# Patient Record
Sex: Female | Born: 1960 | Hispanic: No
Health system: Southern US, Community
[De-identification: ages and names within clinical notes are randomized; demographics above are authoritative.]

## PROBLEM LIST (undated history)

## (undated) DIAGNOSIS — R413 Other amnesia: Secondary | ICD-10-CM

## (undated) DIAGNOSIS — F419 Anxiety disorder, unspecified: Secondary | ICD-10-CM

## (undated) DIAGNOSIS — E7800 Pure hypercholesterolemia, unspecified: Secondary | ICD-10-CM

## (undated) DIAGNOSIS — M549 Dorsalgia, unspecified: Secondary | ICD-10-CM

## (undated) DIAGNOSIS — G4730 Sleep apnea, unspecified: Secondary | ICD-10-CM

## (undated) DIAGNOSIS — R251 Tremor, unspecified: Secondary | ICD-10-CM

## (undated) DIAGNOSIS — I10 Essential (primary) hypertension: Secondary | ICD-10-CM

## (undated) HISTORY — DX: Other amnesia: R41.3

## (undated) HISTORY — PX: OTHER SURGICAL HISTORY: SHX169

## (undated) HISTORY — DX: Dorsalgia, unspecified: M54.9

## (undated) HISTORY — DX: Sleep apnea, unspecified: G47.30

## (undated) HISTORY — PX: TONSILLECTOMY AND ADENOIDECTOMY: SHX28

## (undated) HISTORY — DX: Tremor, unspecified: R25.1

## (undated) HISTORY — DX: Essential (primary) hypertension: I10

## (undated) HISTORY — PX: APPENDECTOMY: SHX54

## (undated) HISTORY — DX: Pure hypercholesterolemia, unspecified: E78.00

## (undated) HISTORY — DX: Anxiety disorder, unspecified: F41.9

---

## 1999-05-12 ENCOUNTER — Other Ambulatory Visit: Admission: RE | Admit: 1999-05-12 | Discharge: 1999-05-12 | Payer: Self-pay | Admitting: Obstetrics & Gynecology

## 2000-05-31 ENCOUNTER — Other Ambulatory Visit: Admission: RE | Admit: 2000-05-31 | Discharge: 2000-05-31 | Payer: Self-pay | Admitting: Obstetrics & Gynecology

## 2000-06-18 ENCOUNTER — Encounter: Payer: Self-pay | Admitting: Obstetrics & Gynecology

## 2000-06-18 ENCOUNTER — Encounter: Admission: RE | Admit: 2000-06-18 | Discharge: 2000-06-18 | Payer: Self-pay | Admitting: Obstetrics & Gynecology

## 2001-06-26 ENCOUNTER — Other Ambulatory Visit: Admission: RE | Admit: 2001-06-26 | Discharge: 2001-06-26 | Payer: Self-pay | Admitting: Obstetrics & Gynecology

## 2001-06-26 ENCOUNTER — Encounter: Admission: RE | Admit: 2001-06-26 | Discharge: 2001-06-26 | Payer: Self-pay | Admitting: Obstetrics & Gynecology

## 2001-06-26 ENCOUNTER — Encounter: Payer: Self-pay | Admitting: Obstetrics & Gynecology

## 2001-11-21 ENCOUNTER — Ambulatory Visit (HOSPITAL_COMMUNITY): Admission: RE | Admit: 2001-11-21 | Discharge: 2001-11-21 | Payer: Self-pay | Admitting: Obstetrics & Gynecology

## 2002-05-02 ENCOUNTER — Encounter: Admission: RE | Admit: 2002-05-02 | Discharge: 2002-05-02 | Payer: Self-pay | Admitting: Neurosurgery

## 2002-05-02 ENCOUNTER — Encounter: Payer: Self-pay | Admitting: Neurosurgery

## 2002-05-14 ENCOUNTER — Encounter: Admission: RE | Admit: 2002-05-14 | Discharge: 2002-05-14 | Payer: Self-pay | Admitting: Neurosurgery

## 2002-05-14 ENCOUNTER — Encounter: Payer: Self-pay | Admitting: Neurosurgery

## 2002-05-26 ENCOUNTER — Encounter: Payer: Self-pay | Admitting: Neurosurgery

## 2002-05-26 ENCOUNTER — Encounter: Admission: RE | Admit: 2002-05-26 | Discharge: 2002-05-26 | Payer: Self-pay | Admitting: Neurosurgery

## 2002-09-12 ENCOUNTER — Other Ambulatory Visit: Admission: RE | Admit: 2002-09-12 | Discharge: 2002-09-12 | Payer: Self-pay | Admitting: Obstetrics & Gynecology

## 2003-04-21 ENCOUNTER — Encounter: Payer: Self-pay | Admitting: Obstetrics & Gynecology

## 2003-04-21 ENCOUNTER — Encounter: Admission: RE | Admit: 2003-04-21 | Discharge: 2003-04-21 | Payer: Self-pay | Admitting: Obstetrics & Gynecology

## 2003-09-15 ENCOUNTER — Ambulatory Visit (HOSPITAL_COMMUNITY): Admission: RE | Admit: 2003-09-15 | Discharge: 2003-09-16 | Payer: Self-pay | Admitting: Neurosurgery

## 2004-02-18 ENCOUNTER — Other Ambulatory Visit: Admission: RE | Admit: 2004-02-18 | Discharge: 2004-02-18 | Payer: Self-pay | Admitting: Obstetrics and Gynecology

## 2005-05-02 ENCOUNTER — Other Ambulatory Visit: Admission: RE | Admit: 2005-05-02 | Discharge: 2005-05-02 | Payer: Self-pay | Admitting: Obstetrics & Gynecology

## 2005-05-29 ENCOUNTER — Encounter: Admission: RE | Admit: 2005-05-29 | Discharge: 2005-05-29 | Payer: Self-pay | Admitting: Orthopedic Surgery

## 2005-09-19 ENCOUNTER — Encounter: Admission: RE | Admit: 2005-09-19 | Discharge: 2005-09-19 | Payer: Self-pay | Admitting: *Deleted

## 2006-02-22 ENCOUNTER — Encounter (INDEPENDENT_AMBULATORY_CARE_PROVIDER_SITE_OTHER): Payer: Self-pay | Admitting: Specialist

## 2006-02-23 ENCOUNTER — Inpatient Hospital Stay (HOSPITAL_COMMUNITY): Admission: RE | Admit: 2006-02-23 | Discharge: 2006-02-24 | Payer: Self-pay | Admitting: Obstetrics & Gynecology

## 2006-10-18 ENCOUNTER — Encounter: Admission: RE | Admit: 2006-10-18 | Discharge: 2006-10-18 | Payer: Self-pay | Admitting: Orthopedic Surgery

## 2007-02-20 ENCOUNTER — Encounter: Admission: RE | Admit: 2007-02-20 | Discharge: 2007-02-20 | Payer: Self-pay | Admitting: Internal Medicine

## 2008-04-06 ENCOUNTER — Encounter: Admission: RE | Admit: 2008-04-06 | Discharge: 2008-04-06 | Payer: Self-pay | Admitting: Obstetrics & Gynecology

## 2008-04-14 ENCOUNTER — Encounter: Admission: RE | Admit: 2008-04-14 | Discharge: 2008-04-14 | Payer: Self-pay | Admitting: Obstetrics & Gynecology

## 2009-03-17 ENCOUNTER — Encounter: Admission: RE | Admit: 2009-03-17 | Discharge: 2009-03-17 | Payer: Self-pay | Admitting: Neurosurgery

## 2009-04-07 ENCOUNTER — Encounter: Admission: RE | Admit: 2009-04-07 | Discharge: 2009-04-07 | Payer: Self-pay | Admitting: Neurosurgery

## 2009-05-18 ENCOUNTER — Encounter: Admission: RE | Admit: 2009-05-18 | Discharge: 2009-05-18 | Payer: Self-pay | Admitting: Obstetrics & Gynecology

## 2009-11-15 ENCOUNTER — Encounter: Admission: RE | Admit: 2009-11-15 | Discharge: 2009-11-15 | Payer: Self-pay | Admitting: Obstetrics & Gynecology

## 2010-04-08 ENCOUNTER — Encounter: Admission: RE | Admit: 2010-04-08 | Discharge: 2010-04-08 | Payer: Self-pay | Admitting: Neurosurgery

## 2010-05-25 ENCOUNTER — Encounter: Admission: RE | Admit: 2010-05-25 | Discharge: 2010-05-25 | Payer: Self-pay | Admitting: Obstetrics & Gynecology

## 2010-10-31 ENCOUNTER — Encounter: Payer: Self-pay | Admitting: Obstetrics & Gynecology

## 2011-02-24 NOTE — Op Note (Signed)
NAMEDIOSELINA, BRUMBAUGH           ACCOUNT NO.:  [id number]   MEDICAL RECORD NO.:  [id number]          PATIENT TYPE:  OIB   LOCATION:  9314                          FACILITY:  [facility]   PHYSICIAN:  Freddy Finner, M.D.   DATE OF BIRTH:  [birth year]   DATE OF PROCEDURE:  02/22/2006  DATE OF DISCHARGE:                                 OPERATIVE REPORT   PREOPERATIVE DIAGNOSES:  1.  Uterine enlargement, suspected adenomyosis.  2.  History of pelvic endometriosis.  3.  History of pelvic adhesions.  4.  Menorrhagia.  5.  Debilitating dysmenorrhea.   POSTOPERATIVE DIAGNOSES:  1.  Uterine enlargement, filmy adhesions of left adnexa.  2.  Old scarring consistent with old endometriosis with no current active      lesions visible.  No other apparent abnormalities in the upper abdomen,      although appendix was not visualized.   OPERATIVE PROCEDURE:  1.  Laparoscopically assisted vaginal hysterectomy.  2.  Bilateral salpingo-oophorectomy.  3.  Lysis of pelvic adhesions.   SURGEON:  Freddy Finner, M.D.   ASSISTANT:  Dineen Kid. Rana Snare, M.D.   ESTIMATED BLOOD LOSS:  300 cc.   COMPLICATIONS:  None.   INDICATIONS FOR PROCEDURE:  The patient is a 50 year old who has previously  had laparoscopically diagnosed pelvic endometriosis with omental and pelvic  adhesions and active endometriosis.  The uterus was also noted to be  enlarged at that time and thought consistent with adenomyosis.  In the  recent past she has had progressively increasing symptoms with severe  dysmenorrhea/menorrhagia.  She is admitted now for surgery.   DESCRIPTION OF PROCEDURE:  She was admitted on the morning of surgery.  She  was given an antibiotic bolus preoperatively.  She was placed in PASOs.  She  was brought to the operating room and placed under adequate general  anesthesia, placed in the dorsal lithotomy position using the [place] stirrup  system.  Betadine prep of abdomen and perineum with vagina was carried out  in the usual fashion with scrub followed by solution.  Latex free setup was  used for this patient.  Bladder was evacuated with a Silastic catheter.  Hulka tenaculum was attached to the cervix.  Sterile drapes were applied.  Two small incisions were made in the abdomen, one at the umbilicus and one  just above the symphysis in the midline.  An 11 mm nonbladed disposable  trocar was introduced at the umbilicus while elevating the anterior  abdominal wall manually.  Examination of pelvic and abdominal contents was  carried out.  A 5 mm trocar was placed in the lower incision for additional  operative port.  There were some filmy adhesions of the left ovary to the  left pelvic sidewall and the epiploic of the sigmoid to the ovary.  These  were easily lysed with blunt dissection.  There was no active evidence of  endometriosis.  Photographs were made.  Using the Gyrus tripolar device  through the operating shallow of the laparoscope the right infundibular  pelvic round and upper broad ligament was developed, sealed, divided,  carried down to  a level just above the uterine artery.  The left side was  then treated identically after lysis of the adhesion.  The bladder was noted  to be advanced on the lower segment.  Attention was then turned vaginally.  Weighted posterior vaginal retractor was placed.  Deaver retractors were  used anteriorly and laterally for  exposure.  The Hulka tenaculum was replaced with a Jacobs tenaculum.  Colpotomy incision was made while tending the mucosa posterior to the  cervix.  Cervix was circumscribed with a scalpel.  The [employer] style  clamp was then used to seal and divide the uterosacrals and bladder pillars  as separate pedicles .  The bladder was further carefully advanced off the  cervix with sharp and blunt dissection.  The cardinal ligament pedicle was  taken, sealed, and divided using Jason Nest device.  Anterior peritoneum was  then entered.  Vessel  pedicles were taken, sealed, and divided.  Uterus was  then delivered through the vaginal introitus.  One filmy peritoneal band was  left but actually tore on delivery of the uterus, but there was no active  bleeding from this peritoneum.  Angles of the vagina were anchored to the  uterosacrals with mattress suture, 0 Monocryl.  Uterosacrals were plicated  in the midline and the posterior peritoneum closed with an interrupted 0  Monocryl suture.  Cuff was closed vertically with figure-of-eights of 0  Monocryl.  Silastic Foley was then placed.  Reinspection abdominally was  carried out using copious irrigation with the [place] irrigating system.  Minimal small bleeding sources were controlled with a bipolar forceps.  All  blood and irrigating solution was aspirated from the abdomen.  Hemostasis  was complete under irrigation and under direct visualization.  The  instruments were removed.  Gas was allowed to escape from the abdomen.  The  incisions were anesthetized with 0.5% plain Marcaine.  The incisions were  closed with interrupted subcuticular sutures of 3-0 Dexon.  Quarter inch  Steri-Strips were applied to the lower incision.  The patient was awakened  and taken to recovery in good condition.      Freddy Finner, M.D.  Electronically Signed     WRN/MEDQ  D:  02/22/2006  T:  02/22/2006  Job:  [id number]

## 2011-02-24 NOTE — H&P (Signed)
[name], [name]           ACCOUNT NO.:  [id number]   MEDICAL RECORD NO.:  [id number]          PATIENT TYPE:  AMB   LOCATION:  SDC                           FACILITY:  [facility]   PHYSICIAN:  Freddy Finner, M.D.   DATE OF BIRTH:  [birth year]   DATE OF ADMISSION:  02/22/2006  DATE OF DISCHARGE:                                HISTORY & PHYSICAL   ADMISSION DIAGNOSIS:  Known pelvic endometriosis, menorrhagia, associated  with pelvic pain, dysmenorrhea unresponsive to hormonal manipulation with  oral contraceptives.   HISTORY OF PRESENT ILLNESS:  The patient is a 50 year old white, married  female, gravida 2, para 1, who has had a long history of pelvic  endometriosis.  Her only term pregnancy delivered in 1996.  She had  laparoscopy in February 2003 for chronic pelvic pain and requested voluntary  sterilization.  At that time, there was slight uterine enlargement thought  consistent with adenomyosis.  There were pelvic and omental adhesions which  were lysed.  There were endometriotic lesions along both the left and right  pelvic sidewalls which were ablated with bipolar coagulation as well as a  uterosacral nerve ablation.  She had coagulation and division of her  fallopian tubes for sterilization.  However, since that time, she has  continued to have significant GYN morbidity including severe pelvic pain  preceding the onset and with the first day of her menses.  She has  dyspareunia.  She has had increasing menorrhagia and has failed to respond  to oral contraceptives for this problem.  She has requested definitive  surgical intervention.  She is admitted at this time for laparoscopically  assisted vaginal hysterectomy and bilateral salpingo-oophorectomy.   REVIEW OF SYMPTOMS:  Her current review of systems is further remarkable for  chronic low back and right hip pain.  She has known significant disk disease  and has previously had a lumbar laminectomy and microdiskectomy at  L5-S1.  Her current GI review is negative.  GU review is negative acutely except as  noted above.  She has a very long history of recurrent bladder infections  and recurrent vaginitis.   PAST MEDICAL HISTORY:  The patient has hypertension for which she takes  Norvasc 5 mg a day and Atenolol 25 mg a day and Lasix 20 mg b.i.d.  She  takes a potassium supplement.  She also has hypercholesterolemia for which  she takes Lipitor 40 mg a day.  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  In addition, she takes medication chronically for her low  back pain including Lyrica 100 mg t.i.d., Oxycodone 50 mg every 4 hours, and  Mobic p.r.n.  She has no other known medical conditions.   PAST MEDICAL HISTORY:  Laparoscopy noted above.  She had laparoscopy for  procedure with two pregnancy attempts in 1994 and 1995.  She had  appendectomy in 1996.  She had tonsillectomy at age 82.  She had  maxillofacial and nasal septoplasty surgeries in 1983.   ALLERGIES:  She is known to be allergic to SULFA AND ERYTHROMYCIN.  She is  also allergic to RADIOGRAPHIC DYE  but is not allergic to the iodine  containing dyes.   She has never had a blood transfusion.   FAMILY HISTORY:  Noncontributory.   PHYSICAL EXAMINATION:  HEENT:  Grossly within normal limits.  Thyroid gland is not palpably  enlarged.  VITAL SIGNS:  Her most recent blood pressure in the office 130/90.  CHEST:  Clear to auscultation.  HEART:  Normal sinus rhythm without murmurs, gallops, and rubs.  ABDOMEN:  Soft, there is no appreciable organomegaly or palpable masses.  There is mild suprapubic tenderness to deep palpation.  PELVIC:  External genitalia, vagina, and cervix were normal to inspection.  Bimanual is essentially normal.  There are no palpable adnexal masses.  RECTAL:  Normal.  Rectovaginal exam confirms the above findings.   ASSESSMENT:  Chronic pelvic pain, menorrhagia, dysmenorrhea, dyspareunia,  failed conservative  management.   PLAN:  Laparoscopically assisted vaginal hysterectomy with bilateral  salpingo-oophorectomy.  The patient has viewed the video in the office  describing the procedure including the potential risks of the procedure and  is prepared to proceed.  She will be treated peri operatively with  antiembolic hose, with IV antibiotic.      Freddy Finner, M.D.  Electronically Signed     WRN/MEDQ  D:  02/21/2006  T:  02/21/2006  Job:  [id number]

## 2011-02-24 NOTE — Discharge Summary (Signed)
[name], [name]           ACCOUNT NO.:  [id number]   MEDICAL RECORD NO.:  [id number]          PATIENT TYPE:  INP   LOCATION:  9314                          FACILITY:  [facility]   PHYSICIAN:  Freddy Finner, M.D.   DATE OF BIRTH:  1961/01/20   DATE OF ADMISSION:  02/22/2006  DATE OF DISCHARGE:  02/24/2006                                 DISCHARGE SUMMARY   DISCHARGE DIAGNOSES:  Uterine enlargement with extensive uterine  adenomyosis, pelvic adhesions, pelvic scarring consistent with pelvic  endometriosis.  Clinical symptoms of severe menorrhagia, chronic pelvic  pain, dyspareunia.  Symptoms unresponsive to conservative management with  oral contraceptives.   OPERATIVE PROCEDURE:  Laparoscopically assisted vaginal hysterectomy,  bilateral salpingo-oophorectomy, lysis of pelvic adhesions.   INTRAOPERATIVE AND POSTOPERATIVE COMPLICATIONS:  None.   DISPOSITION:  The patient was in satisfactory improved condition at time of  discharge.  She was discharged home with progressively increasing physical  activity but to avoid heavy lifting or vaginal entry.  She is to take a  regular diet as tolerated.  She is to resume her chronic pain management  medications which she receives Dr. Vear Clock.  She also was given Xanax 0.5  mg to be taken t.i.d. as needed as supplement to her pain management  protocol.   SIGNIFICANT SECONDARY DIAGNOSIS:  Chronic low back pain and chronic pain  management requiring large amounts of medications.   Details of present illness, past history, family history, review of systems  and physical exam recorded in the admission note.  Her physical findings on  admission were remarkable for the pelvic findings with enlargement and  tenderness of the uterus and significant for her clinical history of severe  problems.   LABORATORY DATA:  During this admission includes a preoperative hemoglobin  of 13.7, postoperative hemoglobin of 10.6, admission prothrombin time and  PTT which were normal.  Normal urinalysis on admission.   HOSPITAL COURSE:  The patient was admitted on the morning of surgery.  The  above described operative procedure was accomplished without any  intraoperative complications.  Her postoperative course was remarkable for  extreme pain which is a chronic ongoing situation for her.  She did remain  afebrile throughout her postoperative stay.  She stayed for two days for  postoperative follow-up, given the chronic nature of her pain.  By the  second postoperative day, she was considered to be in satisfactory condition  for discharge.  She was discharged home with disposition as noted above.      Freddy Finner, M.D.  Electronically Signed     WRN/MEDQ  D:  03/30/2006  T:  03/30/2006  Job:  [id number]

## 2011-02-24 NOTE — Op Note (Signed)
[facility]  Patient:    [name], [name] Visit Number: [id number] MRN: [id number]          Service Type: Attending:  Freddy Finner, M.D. Dictated by:   Freddy Finner, M.D. Proc. Date: 11/21/01                             Operative Report  PREOPERATIVE DIAGNOSIS:       Chronic pelvic pain, known previous endometriosis, request for voluntary sterilization, uterine enlargement.  POSTOPERATIVE DIAGNOSES:      Uterine enlargement probably consistent with adenomyosis, recurrent pelvic endometriosis, filmy adhesions of ovaries to side walls, of pelvis, omental adhesions to anterior abdominal wall, surgical absence of appendix.  OPERATIVE PROCEDURE:          Laparoscopy, lysis of omental adhesions, lysis of paraovarian adhesions, bipolar fulguration of filmy adhesions of ovaries, fulguration of endometriotic lesions of left and right pelvic side walls, uterosacral nerve ablation with bipolar coagulation, bipolar fulguration of isthmic portion of each fallopian tube and sharp division of fallopian tube through the fulgurated segment.  SURGEON:                      Freddy Finner, M.D.  ANESTHESIA:                   General.  ESTIMATED BLOOD LOSS:         Less than 100 cc.  INTRAOPERATIVE COMPLICATIONS: None.  PROCEDURE:                    Patient was admitted on the morning of surgery, brought to the operating room, placed under adequate general endotracheal anesthesia.  Placed in the dorsal lithotomy position using the [place] stirrups system.  Betadine prep of abdomen and perineum and vagina was carried out in the usual fashion.  Bladder was evacuated with the [facility] catheter.  Cervix was visualized, grasped with a single tooth tenaculum and a Hulka tenaculum applied without difficulty.  Sterile drapes were applied.  Two small incisions were made, one at the umbilicus and one just above the symphysis.  An 11 mm trocar was introduced through  the upper incision while elevating the anterior abdominal wall manually.  Inspection revealed adequate placement without evidence of injury on entry.  There were dense adhesions of the omentum to the anterior abdominal wall in a linear stripe virtually down the midline of the abdomen extending for a distance of approximately 12 cm.  The reticulating endo shears were used through the operating channel of the laparoscope with unipolar cautery to dissect the omental adhesions free.  Later bipolar was required for complete hemostasis of the dissected omentum and anterior abdominal wall.  This was accomplished without significant difficulty. Inspection of the pelvis then revealed a boggy enlarged uterus which was approximately 8 weeks size, but overall had a relatively smooth contour.  It was acutely retroverted.  The uterus was elevated and the cul-de-sac was found to be free of disease.  There were endometriotic lesions along the pelvic side walls on each side which were very characteristic.  There were filmy adhesions from each ovary to the lateral side wall that were easily freed while elevating the ovaries with a blunt probe.  Each of these were fulgurated as were the visible evidence of lesions along the pelvic side walls using the bipolar forceps.  Great care was exercised over the ureters  to avoid injury. The lesions were very close to the ureters on either side.  The uterosacrals were placed on stretch using the Hulka tenaculum and they were fulgurated at their junction with the cervix using the bipolar forceps.  The isthmic portion of each fallopian tube was then fulgurated with the bipolar forceps and divided sharply with the [place] scissors.  Hemostasis was noted to be completely adequate after irrigation with lactated Ringers solution using the [place] system.  Irrigating solution was aspirated from the abdomen. Instruments were removed.  Incision sites were injected with 0.25%  plain Marcaine for postoperative analgesia.  The incisions were closed with interrupted subcuticular sutures of 3-0 Dexon.  Steri-Strips were applied to the lower incision.  The patient tolerated the operative procedure well and was taken to the recovery room in good condition.  She will be discharged home with routine outpatient surgical instructions with Vicodin for pain with progressively increasing activity over the next 48-72 hours and normal thereafter as tolerated.  She is to take a regular diet as tolerated. Dictated by:   Freddy Finner, M.D. Attending:  Freddy Finner, M.D. DD:  11/21/01 TD:  11/21/01 Job: [id number]

## 2011-02-24 NOTE — Op Note (Signed)
[name], [name]                     ACCOUNT NO.:  [id number]   MEDICAL RECORD NO.:  [id number]                   PATIENT TYPE:  OIB   LOCATION:  3005                                 FACILITY:  [facility]   PHYSICIAN:  Payton Doughty, M.D.                   DATE OF BIRTH:  [birth year]   DATE OF PROCEDURE:  09/15/2003  DATE OF DISCHARGE:  09/16/2003                                 OPERATIVE REPORT   PREOPERATIVE DIAGNOSIS:  Herniated disk on the left side at L5-S1.   POSTOPERATIVE DIAGNOSIS:  Herniated disk on the left side at L5-S1.   OPERATION PERFORMED:  L5-S1 laminectomy and diskectomy on the left.   SURGEON:  Payton Doughty, M.D.   ASSISTANT:  1. Clydene Fake, M.D.  2. Bertram.   ANESTHESIA:  General endotracheal.   PREP:  Sterile Betadine prep and scrub with alcohol wipe.   COMPLICATIONS:  None.   INDICATIONS FOR PROCEDURE:  The patient is a 50 year old right-handed white  female with left S1 radiculopathy and herniated disk at 5-1 on the left.   DESCRIPTION OF PROCEDURE:  The patient was taken to the operating room,  smoothly anesthetized, intubated, and placed  prone on the operating table.  Following shave, prepped and draped in the usual sterile fashion, skin was  infiltrated with 1% lidocaine 1:400,000 epinephrine and a skin incision was  made over the lamina of L5.  The left L5 lamina was isolated, dissected free  in the subperiosteal plane.  Intraoperative x-ray confirmed correctness of  level.  Having confirmed correctness of level, hemisemilaminectomy of L5 was  carried out to the top of ligamentum flavum.  It was removed in retrograde  fashion.  Lateral dissection through the fat revealed the lateral margin of  S1 nerve root which was dissected free and retracted medially.  Immediately  underneath it was a herniated disk of modest proportions.  Further  foraminotomy was carried out over the nerve root.  The nerve root was then  retracted medially and the  annular fibers were divided with a 15 blade.  The  disk space was evacuated of all graftable fragments, gentle in place  curettage was used to displace any marginally clinging fragments and they  were also removed.  Following complete decompression of the nerve root it  was explored in all quadrants and found to be free. The wound was irrigated  and hemostasis assured.  Depo-Medrol soaked fat was placed in the  laminectomy defect.  The fascia was reapproximated with 0 Vicryl in  interrupted fashion and subcutaneous tissue was reapproximated  with 0 Vicryl in interrupted fashion.  Subcuticular tissues were  reapproximated with 3-0 Vicryl in interrupted fashion.  Skin was closed with  4-0 Vicryl in running subcuticular fashion.  Benzoin and Steri-Strips were  placed and made occlusive with Telfa and OpSite.  The patient was then  transferred to the recovery room in good  condition.                                               Payton Doughty, M.D.    MWR/MEDQ  D:  09/15/2003  T:  09/16/2003  Job:  [id number]

## 2011-02-24 NOTE — H&P (Signed)
[name], [name]                     ACCOUNT NO.:  [id number]   MEDICAL RECORD NO.:  [id number]                   PATIENT TYPE:  OIB   LOCATION:  2895                                 FACILITY:  [facility]   PHYSICIAN:  Payton Doughty, M.D.                   DATE OF BIRTH:  10/27/1960   DATE OF ADMISSION:  09/15/2003  DATE OF DISCHARGE:                                HISTORY & PHYSICAL   ADMISSION DIAGNOSIS:  Herniated disk, L5-S1, left.   HISTORY OF PRESENT ILLNESS:  The patient is a 50 year old right-handed white  girl who visited our office a couple of years ago.  I saw her in November.  She has had difficulty with her back and left leg pain, radiating down the  lateral aspect and the dorsum of the foot.  She got studied at [facility] and had  positive discograms concordant for leg pain demonstrated annular defect.  Has been managed conservatively for three years, not getting any better, and  is getting a little bit aggravated with her lack of progress.  When I  visited with her I obtained another MRI which shows a disk centrally and to  the left at L5-S1, and she is here now for diskectomy.   PAST MEDICAL HISTORY:  Hypertension, which is treated.   MEDICATIONS:  1. Neurontin.  2. Anti-inflammatory.  3. Elavil.  4. Norvasc.  5. Bextra.  6. Valtrex.   ALLERGIES:  LATEX.   FAMILY HISTORY:  Noncontributory.   REVIEW OF SYSTEMS:  Remarkable for back pain and leg pain.   PHYSICAL EXAMINATION:  HEENT:  Within normal limits.  NECK:  She has reasonable range of motion of the neck.  CHEST:  Clear.  CARDIAC:  Regular rate and rhythm.  ABDOMEN:  Nontender, no hepatosplenomegaly.  EXTREMITIES:  Without clubbing, cyanosis.  GENITOURINARY:  Deferred.  NEUROLOGIC:  Peripheral pulses are good.  She is awake, alert and oriented.  Cranial nerves are intact.  Motor exam shows 5/5 strength throughout the  upper and lower extremities.  She has a positive straight leg raise on the  left, and  a left S5 sensory deficit. The ankle jerk is diminished on the  left.   CLINICAL IMPRESSION:  Left S1 radiculopathy with a herniated disk L5-S1.  She is here for lumbar laminectomy and diskectomy.  The risks and benefits  of this approach have been discussed with her, and she wishes to proceed.                                                Payton Doughty, M.D.    MWR/MEDQ  D:  09/15/2003  T:  09/15/2003  Job:  [id number]

## 2011-07-12 ENCOUNTER — Other Ambulatory Visit: Payer: Self-pay | Admitting: Obstetrics & Gynecology

## 2011-07-12 DIAGNOSIS — Z1231 Encounter for screening mammogram for malignant neoplasm of breast: Secondary | ICD-10-CM

## 2011-08-09 ENCOUNTER — Ambulatory Visit: Payer: Self-pay

## 2011-08-29 ENCOUNTER — Ambulatory Visit: Payer: Self-pay

## 2011-09-15 ENCOUNTER — Ambulatory Visit
Admission: RE | Admit: 2011-09-15 | Discharge: 2011-09-15 | Disposition: A | Discharge status: Home / Self Care | Payer: BC Managed Care – PPO | Source: Ambulatory Visit | Attending: Obstetrics & Gynecology | Admitting: Obstetrics & Gynecology

## 2011-09-15 DIAGNOSIS — Z1231 Encounter for screening mammogram for malignant neoplasm of breast: Secondary | ICD-10-CM

## 2012-09-19 ENCOUNTER — Other Ambulatory Visit: Payer: Self-pay | Admitting: Obstetrics & Gynecology

## 2012-09-19 DIAGNOSIS — Z1231 Encounter for screening mammogram for malignant neoplasm of breast: Secondary | ICD-10-CM

## 2012-10-01 ENCOUNTER — Ambulatory Visit: Payer: BC Managed Care – PPO

## 2013-02-25 ENCOUNTER — Ambulatory Visit
Admission: RE | Admit: 2013-02-25 | Discharge: 2013-02-25 | Disposition: A | Discharge status: Home / Self Care | Payer: BC Managed Care – PPO | Source: Ambulatory Visit | Attending: Obstetrics & Gynecology | Admitting: Obstetrics & Gynecology

## 2013-02-25 DIAGNOSIS — Z1231 Encounter for screening mammogram for malignant neoplasm of breast: Secondary | ICD-10-CM

## 2014-02-10 ENCOUNTER — Other Ambulatory Visit: Payer: Self-pay | Admitting: Obstetrics and Gynecology

## 2014-02-10 DIAGNOSIS — N6452 Nipple discharge: Secondary | ICD-10-CM

## 2014-02-19 ENCOUNTER — Other Ambulatory Visit: Payer: BC Managed Care – PPO

## 2014-02-24 ENCOUNTER — Other Ambulatory Visit: Payer: Self-pay | Admitting: Obstetrics and Gynecology

## 2014-02-24 ENCOUNTER — Ambulatory Visit
Admission: RE | Admit: 2014-02-24 | Discharge: 2014-02-24 | Disposition: A | Discharge status: Home / Self Care | Payer: BC Managed Care – PPO | Source: Ambulatory Visit | Attending: Obstetrics and Gynecology | Admitting: Obstetrics and Gynecology

## 2014-02-24 DIAGNOSIS — N6452 Nipple discharge: Secondary | ICD-10-CM

## 2014-09-22 ENCOUNTER — Ambulatory Visit (INDEPENDENT_AMBULATORY_CARE_PROVIDER_SITE_OTHER): Payer: BC Managed Care – PPO | Admitting: Neurology

## 2014-09-22 ENCOUNTER — Encounter: Payer: Self-pay | Admitting: Neurology

## 2014-09-22 ENCOUNTER — Other Ambulatory Visit: Payer: Self-pay | Admitting: Obstetrics and Gynecology

## 2014-09-22 VITALS — BP 148/87 | HR 80 | Ht 62.0 in | Wt 226.0 lb

## 2014-09-22 DIAGNOSIS — R251 Tremor, unspecified: Secondary | ICD-10-CM | POA: Insufficient documentation

## 2014-09-22 DIAGNOSIS — R413 Other amnesia: Secondary | ICD-10-CM | POA: Insufficient documentation

## 2014-09-22 DIAGNOSIS — G4730 Sleep apnea, unspecified: Secondary | ICD-10-CM | POA: Insufficient documentation

## 2014-09-22 DIAGNOSIS — N643 Galactorrhea not associated with childbirth: Secondary | ICD-10-CM

## 2014-09-22 DIAGNOSIS — N6452 Nipple discharge: Secondary | ICD-10-CM

## 2014-09-22 DIAGNOSIS — M549 Dorsalgia, unspecified: Secondary | ICD-10-CM | POA: Insufficient documentation

## 2014-09-22 NOTE — Progress Notes (Signed)
PATIENT: [name] DOB: [birth year]  HISTORICAL  [name] is a 53 years old right-handed female, referred by her pain management Dr. Thyra BreedMark Phillips, and her primary care physician Dr. Estelle JuneGrieganti for evaluation of memory loss  She had a past medical history of hypertension, hyperlipidemia, chronic low back pain, previous lumbar decompression surgery for right lumbar radiculopathy, she has been under pain management since 2005, currently taking Nycynta ER 150 mg once a day, this was a decreased dose from 300 mg since September 2015, also oxycodone 5-7.5 mg 3 times a day, she also had past medical history of obstructive sleep apnea since 2012, but she could not tolerate CPAP machine, not using it regularly she had excessive marital stress in recent few years  She complains of gradual onset memory trouble since March 2015, she had 14 years of formal training, was a certified Psychologist, sport and exercisemammogram technician, as a matter of fact, she was able to successfully recertified in February 2015 after she stayed home for 4 years for her daughter.  She currently has as needed mammogram job for [employer], she works 3-4 hours each month, was noted to be slow with her computer skill, despite repeat training, she has mild difficulty handling her daily activity, but also complains of excessive stress at home  She continue have low back pain, depression, anxiety, snoring, blood in stool, feeling hot, achy muscles, depression, anxiety, decreased energy, memory loss, snoring.  REVIEW OF SYSTEMS: Full 14 system review of systems performed and notable only for weight gain, fatigue, swelling in leg  ALLERGIES: Allergies  Allergen Reactions  . Erythromycin Base Other (See Comments)  . Iodinated Diagnostic Agents Hives  . Iohexol      Code: HIVES, Desc: ISOVUE//DOES OK WHEN PREMEDICATED WITH BENADRYL  Code: HIVES, Desc: ISOVUE//DOES OK WHEN PREMEDICATED WITH BENADRYL   . Sulfa  Antibiotics Hives    HOME MEDICATIONS: No current outpatient prescriptions on file prior to visit.   No current facility-administered medications on file prior to visit.    PAST MEDICAL HISTORY: Past Medical History  Diagnosis Date  . Memory loss   . Back pain   . High blood pressure   . High cholesterol   . Anxiety   . Sleep apnea   . Tremor     PAST SURGICAL HISTORY: Past Surgical History  Procedure Laterality Date  . Appendectomy    . Tonsillectomy and adenoidectomy    . Maxofacial      FAMILY HISTORY: Family History  Problem Relation Age of Onset  . Cancer Father   . Multiple sclerosis Brother   . High Cholesterol Mother   . High Cholesterol Father   . Diabetes Father   . Arthritis Mother     SOCIAL HISTORY:  History   Social History  . Marital Status: Married    Spouse Name: Lewis    Number of Children: 1  . Years of Education: college   Occupational History  . mammography   Social History Main Topics  . Smoking status: Never Smoker   . Smokeless tobacco: Not on file  . Alcohol Use: No  . Drug Use: No  . Sexual Activity: Not on file   Other Topics Concern  . Not on file   Social History Narrative   Patient lives at home with her husband Melvyn Neth(Lewis).   Patient works PRN.    Education college.   Right handed.   Caffeine One coke daily.     PHYSICAL EXAM  Filed Vitals:   09/22/14 1510  BP: 148/87  Pulse: 80  Height: 5\' 2"  (1.575 m)  Weight: 226 lb (102.513 kg)    Not recorded      Body mass index is 41.33 kg/(m^2).   Generalized: In no acute distress  Neck: Supple, no carotid bruits   Cardiac: Regular rate rhythm  Pulmonary: Clear to auscultation bilaterally  Musculoskeletal: No deformity  Neurological examination  Mentation: Alert oriented to time, place, history taking, and causual conversation, Mini-Mental Status Examination 30 out of 30  Cranial nerve II-XII: Pupils were equal round reactive to light. Extraocular  movements were full.  Visual field were full on confrontational test. Bilateral fundi were sharp.  Facial sensation and strength were normal. Hearing was intact to finger rubbing bilaterally. Uvula tongue midline.  Head turning and shoulder shrug and were normal and symmetric.Tongue protrusion into cheek strength was normal.  Motor: Normal tone, bulk and strength.  Sensory: Intact to fine touch, pinprick, preserved vibratory sensation, and proprioception at toes.  Coordination: Normal finger to nose, heel-to-shin bilaterally there was no truncal ataxia  Gait: Rising up from seated position without assistance, normal stance, without trunk ataxia, moderate stride, good arm swing, smooth turning, able to perform tiptoe, and heel walking without difficulty.   Romberg signs: Negative  Deep tendon reflexes: Brachioradialis 2/2, biceps 2/2, triceps 2/2, patellar 2/2, Achilles 2/2, plantar responses were flexor bilaterally.   DIAGNOSTIC DATA (LABS, IMAGING, TESTING) - I reviewed patient records, labs, notes, testing and imaging myself where available.  ASSESSMENT AND PLAN  [name] is a 53 y.o. female with few months history of memory trouble, Mini-Mental Status Examination is 30 out of 30, this happened in the setting of excessive stress, Which most likely play a major role in her complains,  Complete evaluation with MRI of brain Laboratory evaluation from her primary care, report recent mildly low B12, low vitamin D 20, otherwise normal CBC, CMP, TSH.     Levert FeinsteinYijun Findley Vi M.D. Ph.D.  [facility] [address], [address] [place], [place] [address] [phone]

## 2014-10-07 ENCOUNTER — Ambulatory Visit (INDEPENDENT_AMBULATORY_CARE_PROVIDER_SITE_OTHER): Payer: BC Managed Care – PPO

## 2014-10-07 ENCOUNTER — Ambulatory Visit
Admission: RE | Admit: 2014-10-07 | Discharge: 2014-10-07 | Disposition: A | Discharge status: Home / Self Care | Payer: BC Managed Care – PPO | Source: Ambulatory Visit | Attending: Obstetrics and Gynecology | Admitting: Obstetrics and Gynecology

## 2014-10-07 DIAGNOSIS — N6452 Nipple discharge: Secondary | ICD-10-CM

## 2014-10-07 DIAGNOSIS — N643 Galactorrhea not associated with childbirth: Secondary | ICD-10-CM

## 2014-10-07 DIAGNOSIS — G4730 Sleep apnea, unspecified: Secondary | ICD-10-CM

## 2014-10-07 DIAGNOSIS — R413 Other amnesia: Secondary | ICD-10-CM

## 2014-10-07 DIAGNOSIS — R251 Tremor, unspecified: Secondary | ICD-10-CM

## 2014-10-12 ENCOUNTER — Telehealth: Payer: Self-pay | Admitting: Neurology

## 2014-10-12 DIAGNOSIS — R9389 Abnormal findings on diagnostic imaging of other specified body structures: Secondary | ICD-10-CM | POA: Insufficient documentation

## 2014-10-12 NOTE — Telephone Encounter (Signed)
I have called her  There are serpiginous T2FLAIR hyperintensities near the right frontal horn in the subcortical and periventricular white matter, suspicious for a vascular malformation such as developmental venous anomaly with small cavernoma. Consider post-contrast imaging for further evaluation. 2. Single right frontal juxtacortical focus of non-specific gliosis (series 6, image 17).  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 4. No acute findings.   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  MRI brain with contrast, keep follow up appt.

## 2014-10-16 ENCOUNTER — Inpatient Hospital Stay: Admission: RE | Admit: 2014-10-16 | Payer: BC Managed Care – PPO | Source: Ambulatory Visit

## 2014-11-17 ENCOUNTER — Telehealth: Payer: Self-pay | Admitting: Neurology

## 2014-11-17 NOTE — Telephone Encounter (Signed)
The patient had Lab work done from [facility] her Dr did a big memory lab work on her at [facility] and she wants to know if her lab work has been sent to Dr. Terrace ArabiaYan. Her best number to contact her is [phone]

## 2014-11-17 NOTE — Telephone Encounter (Signed)
She wanted to check that her labs taken in November were [facility].  They were received and reviewed at her last office visit.  She is aware of the review.  She has an appointment scheduled in March.

## 2014-11-19 ENCOUNTER — Ambulatory Visit: Payer: BC Managed Care – PPO | Admitting: Neurology

## 2014-12-16 ENCOUNTER — Ambulatory Visit
Admission: RE | Admit: 2014-12-16 | Discharge: 2014-12-16 | Disposition: A | Discharge status: Home / Self Care | Payer: BC Managed Care – PPO | Source: Ambulatory Visit | Attending: Neurology | Admitting: Neurology

## 2014-12-16 ENCOUNTER — Inpatient Hospital Stay
Admission: RE | Admit: 2014-12-16 | Discharge: 2014-12-16 | Disposition: A | Discharge status: Home / Self Care | Payer: Self-pay | Source: Ambulatory Visit | Attending: Neurology | Admitting: Neurology

## 2014-12-16 ENCOUNTER — Other Ambulatory Visit: Payer: Self-pay | Admitting: Neurology

## 2014-12-16 ENCOUNTER — Telehealth: Payer: Self-pay | Admitting: Neurology

## 2014-12-16 DIAGNOSIS — R9389 Abnormal findings on diagnostic imaging of other specified body structures: Secondary | ICD-10-CM

## 2014-12-16 MED ORDER — GADOBENATE DIMEGLUMINE 529 MG/ML IV SOLN
20.0000 mL | Freq: Once | INTRAVENOUS | Status: AC | PRN
  Administered 2014-12-16: 20 mL via INTRAVENOUS

## 2014-12-16 NOTE — Telephone Encounter (Signed)
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 2. Nearby susceptibility in the anterior right putamen is compatible with hemosiderin, but cavernous vascular malformation is not favored as the lesion is without any T2 signal changes which are generally seen with cavernous malformation.

## 2014-12-17 NOTE — Telephone Encounter (Signed)
Pt aware of results - she will keep her appt on 12/22/14 to further discuss.

## 2014-12-22 ENCOUNTER — Ambulatory Visit (INDEPENDENT_AMBULATORY_CARE_PROVIDER_SITE_OTHER): Payer: BC Managed Care – PPO | Admitting: Neurology

## 2014-12-22 ENCOUNTER — Encounter: Payer: Self-pay | Admitting: Neurology

## 2014-12-22 VITALS — BP 147/85 | HR 81 | Ht 62.0 in | Wt 228.0 lb

## 2014-12-22 DIAGNOSIS — R413 Other amnesia: Secondary | ICD-10-CM | POA: Diagnosis not present

## 2014-12-22 DIAGNOSIS — G4730 Sleep apnea, unspecified: Secondary | ICD-10-CM

## 2014-12-22 DIAGNOSIS — R9389 Abnormal findings on diagnostic imaging of other specified body structures: Secondary | ICD-10-CM

## 2014-12-22 DIAGNOSIS — R938 Abnormal findings on diagnostic imaging of other specified body structures: Secondary | ICD-10-CM | POA: Diagnosis not present

## 2014-12-22 NOTE — Progress Notes (Signed)
PATIENT: [name] DOB: [birth year]  HISTORICAL  [name] is a 54 years old right-handed female, referred by her pain management Dr. Thyra Breed, and her primary care physician Dr. Estelle June for evaluation of memory loss  She had a past medical history of hypertension, hyperlipidemia, chronic low back pain, previous lumbar decompression surgery for right lumbar radiculopathy, she has been under pain management since 2005, currently taking Nycynta ER 150 mg once a day, this was a decreased dose from 300 mg since September 2015, also oxycodone 5-7.5 mg 3 times a day, she also had past medical history of obstructive sleep apnea since 2012, but she could not tolerate CPAP machine, not using it regularly  She had excessive marital stress in recent few years  She complains of gradual onset memory trouble since March 2015, she had 14 years of formal training, was a certified Psychologist, sport and exercise, as a matter of fact, she was able to successfully recertified in February 2015 after she stayed home for 4 years for her daughter.  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  REVIEW OF SYSTEMS: Full 14 system review of systems performed and notable only  for weight gain, fatigue, swelling in leg  ALLERGIES: Allergies  Allergen Reactions  . Erythromycin Base Other (See Comments)  . Iodinated Diagnostic Agents Hives  . Iohexol      Code: HIVES, Desc: ISOVUE//DOES OK WHEN PREMEDICATED WITH BENADRYL  Code: HIVES, Desc: ISOVUE//DOES OK WHEN PREMEDICATED WITH BENADRYL   . Isovue [Iopamidol] Hives  . Sulfa Antibiotics Hives    HOME MEDICATIONS: Current Outpatient Prescriptions on File Prior to Visit  Medication Sig Dispense Refill  . amLODipine (NORVASC) 2.5 MG tablet Take 2.5 mg by mouth daily.    Marland Kitchen atenolol (TENORMIN) 25 MG tablet Take 25 mg by mouth daily.    . cyclobenzaprine (AMRIX) 15 MG 24 hr capsule Take 15 mg by mouth daily as needed for muscle spasms.    . famotidine (PEPCID) 20 MG tablet Take 20 mg by mouth 2 (two) times daily.    . furosemide (LASIX) 20 MG tablet Take 20 mg by mouth. As directed    . oxyCODONE-acetaminophen (PERCOCET/ROXICET) 5-325 MG per tablet Take 1 tablet by mouth every 4 (four) hours as needed for severe pain.    . rosuvastatin (CRESTOR) 10 MG tablet Take 10 mg by mouth daily.    . Tapentadol HCl (NUCYNTA ER) 150 MG TB12 Take by mouth.     No current facility-administered medications on file prior to visit.    PAST MEDICAL HISTORY: Past Medical History  Diagnosis Date  . Memory loss   . Back pain   . High blood pressure   . High cholesterol   . Anxiety   . Sleep apnea   . Tremor  PAST SURGICAL HISTORY: Past Surgical History  Procedure Laterality Date  . Appendectomy    . Tonsillectomy and adenoidectomy    . Maxofacial      FAMILY HISTORY: Family History  Problem Relation Age of Onset  . Cancer Father   . Multiple sclerosis Brother   . High Cholesterol Mother   . High Cholesterol Father   . Diabetes Father   . Arthritis Mother     SOCIAL HISTORY:  History   Social History  . Marital Status: Married    Spouse Name: Lewis    Number of Children: 1  . Years of Education:  college   Occupational History  . mammography Melvyn Nethocial History Main Topics  . Smoking status: Never Smoker   . Smokeless tobacco: Not on file  . Alcohol Use: No  . Drug Use: No  . Sexual Activity: Not on file   Other Topics Concern  . Not on file   Social History Narrative   Patient lives at home with her husband (Lewis).   Patient works PRN.    Education college.   Right handed.   Caffeine One coke daily.     PHYSICAL EXAM   Filed Vitals:   12/22/14 1008  BP: 147/85  Pulse: 81  Height:  (1.575 m)  Weight: 228 lb (103.42 kg)    Not recorded      Body mass index is 41.69 kg/(m^2).  PHYSICAL EXAMNIATION:  Gen: NAD, conversant, well nourised, obese, well groomed                     Cardiovascular: Regular rate rhythm, no peripheral edema, warm, nontender. Eyes: Conjunctivae clear without exudates or hemorrhage Neck: Supple, no carotid bruise. Pulmonary: Clear to auscultation bilaterally   NEUROLOGICAL EXAM:  MENTAL STATUS: Speech:    Speech is normal; fluent and spontaneous with normal comprehension.  Cognition:  Montreal  cognitive assessment MOCA 28/30, missed 2 out of 5 delayed recall  CRANIAL NERVES: CN II: Visual fields are full to confrontation. Fundoscopic exam is normal with sharp discs and no vascular changes. Venous pulsations are present bilaterally. Pupils are 4 mm and briskly reactive to light. Visual acuity is 20/20 bilaterally. CN III, IV, VI: extraocular movement are normal. No ptosis. CN V: Facial sensation is intact to pinprick in all 3 divisions bilaterally. Corneal responses are intact.  CN VII: Face is symmetric with normal eye closure and smile. CN VIII: Hearing is normal to rubbing fingers CN IX, X: Palate elevates symmetrically. Phonation is normal. CN XI: Head turning and shoulder shrug are intact CN XII: Tongue is midline with normal movements and no atrophy.  MOTOR: There is no pronator drift of out-stretched arms. Muscle  bulk and tone are normal. Muscle strength is normal.   Shoulder abduction Shoulder external rotation Elbow flexion Elbow extension Wrist flexion Wrist extension Finger abduction Hip flexion Knee flexion Knee extension Ankle dorsi flexion Ankle plantar flexion  R L REFLEXES: Reflexes are 2+ and symmetric at the biceps, triceps, knees, and ankles. Plantar responses are flexor.  SENSORY: Light touch, pinprick, position sense, and vibration sense are intact in fingers and toes.  COORDINATION: Rapid alternating movements and fine finger movements are intact. There is no dysmetria on finger-to-nose and heel-knee-shin. There are no abnormal or extraneous movements.   GAIT/STANCE:  Posture is normal. Gait is steady with normal steps, base, arm swing, and turning. Heel and toe walking are normal. Tandem gait is normal.  Romberg is absent.   DIAGNOSTIC DATA (LABS, IMAGING, TESTING) - I reviewed patient records, labs, notes, testing and imaging myself where available.  ASSESSMENT AND PLAN  [name] is a 54 y.o. female with few months history of memory trouble, Mini-Mental Status Examination is 30 out of 30, MOCA 28 out of 30 this happened in the setting of excessive stress, Which most likely play a major role in her complains,  MRI brain showed right frontal venous anomaly, which will not explain her memory loss, I will refer her to neuropsychiatric evaluation Return to clinic in 3 months   Levert FeinsteinYijun Ta Fair M.D. Ph.D.  [facility] [address], [address] [place], [place] [address] [phone]

## 2014-12-23 ENCOUNTER — Other Ambulatory Visit: Payer: BC Managed Care – PPO

## 2015-03-12 ENCOUNTER — Ambulatory Visit: Payer: BC Managed Care – PPO | Attending: Psychology | Admitting: Psychology

## 2015-03-12 DIAGNOSIS — R413 Other amnesia: Secondary | ICD-10-CM

## 2015-03-12 DIAGNOSIS — F331 Major depressive disorder, recurrent, moderate: Secondary | ICD-10-CM
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
## 2015-03-30 ENCOUNTER — Encounter: Payer: Self-pay | Admitting: Neurology

## 2015-03-30 ENCOUNTER — Ambulatory Visit (INDEPENDENT_AMBULATORY_CARE_PROVIDER_SITE_OTHER): Payer: BC Managed Care – PPO | Admitting: Neurology

## 2015-03-30 VITALS — BP 186/95 | HR 72 | Ht 62.0 in | Wt 216.0 lb

## 2015-03-30 DIAGNOSIS — F329 Major depressive disorder, single episode, unspecified: Secondary | ICD-10-CM | POA: Diagnosis not present

## 2015-03-30 DIAGNOSIS — R413 Other amnesia: Secondary | ICD-10-CM | POA: Diagnosis not present

## 2015-03-30 DIAGNOSIS — F32A Depression, unspecified: Secondary | ICD-10-CM

## 2015-03-30 NOTE — Progress Notes (Signed)
Chief Complaint  Patient presents with  . Memory Loss    MMSE 30/30 - 21 animals. She is still having problems with memory, attention and concentration.  She completed her neuropsych testing with Dr. Leonides Cave and was referred to Dr. Geannie Risen for depression.  She is currently going through a difficult separation from her husband of 30 years due to infidelity.      PATIENT: [name] DOB: 03-24-1961  HISTORICAL  [name] is a 54 years old right-handed female, referred by her pain management Dr. Thyra Breed, and her primary care physician Dr. Estelle June for evaluation of memory loss  She had a past medical history of hypertension, hyperlipidemia, chronic low back pain, previous lumbar decompression surgery for right lumbar radiculopathy, she has been under pain management since 2005, currently taking Nycynta ER 150 mg once a day, this was a decreased dose from 300 mg since September 2015, also oxycodone 5-7.5 mg 3 times a day, she also had past medical history of obstructive sleep apnea since 2012, but she could not tolerate CPAP machine, not using it regularly  She had excessive marital stress in recent few years  She complains of gradual onset memory trouble since March 2015, she had 14 years of formal training, was a certified Psychologist, sport and exercise, as a matter of fact, she was able to successfully recertified in February 2015 after she stayed home for 4 years for her daughter.  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  UPDATE March 15th 2016: She has to resign  her job, because continued memory loss, her daughter moved to college in [place], We have reviewed MRI of the brain, Right frontal lobe developmental venous anomaly confirmed  She reported few episodes of waking up in the middle of the night, confused, fell, lower sacral area pain.  UPDATE March 30 2015: She continues to complain of marital stress, I reviewed record from Dr. Leonides Cave, her memory trouble is most likely related to her mood disorder, stress, she is now seen seen by her Psychiatrist, Dr. Otis [place] Sink, taking abilify  daily.   REVIEW OF SYSTEMS: Full 14 system review of systems performed and notable only for apnea, snoring, sleep walking, joint pain, low back pain, neck stiffness, depression anxiety  ALLERGIES: Allergies  Allergen Reactions  . Erythromycin Base Other (See Comments)  . Iodinated Diagnostic Agents Hives  . Iohexol      Code: HIVES, Desc: ISOVUE//DOES OK WHEN PREMEDICATED WITH BENADRYL  Code: HIVES, Desc: ISOVUE//DOES OK WHEN PREMEDICATED WITH BENADRYL   . Isovue [Iopamidol] Hives  . Sulfa Antibiotics Hives    HOME MEDICATIONS: Current Outpatient Prescriptions on File Prior to Visit  Medication Sig Dispense Refill  . amLODipine (NORVASC) 2.5 MG tablet Take 2.5 mg by mouth daily.    Marland Kitchen atenolol (TENORMIN) 25 MG tablet Take 25 mg by mouth daily.    . cyclobenzaprine (AMRIX) 15 MG 24 hr capsule Take 15 mg by mouth daily as needed for muscle spasms.    . famotidine (PEPCID) 20 MG tablet Take 20 mg by mouth 2 (two) times daily.    . furosemide (LASIX) 20 MG tablet  Take 20 mg by mouth. As directed    . oxyCODONE-acetaminophen (PERCOCET/ROXICET) 5-325 MG per tablet Take 1 tablet by mouth every 4 (four) hours as needed for severe pain.    . rosuvastatin (CRESTOR) 10 MG tablet Take 10 mg by mouth daily.    . Tapentadol HCl (NUCYNTA ER) 150 MG TB12 Take by mouth.     No current facility-administered medications on file prior to visit.    PAST MEDICAL HISTORY: Past  Medical History  Diagnosis Date  . Memory loss   . Back pain   . High blood pressure   . High cholesterol   . Anxiety   . Sleep apnea   . Tremor     PAST SURGICAL HISTORY: Past Surgical History  Procedure Laterality Date  . Appendectomy    . Tonsillectomy and adenoidectomy    . Maxofacial      FAMILY HISTORY: Family History  Problem Relation Age of Onset  . Cancer Father   . Multiple sclerosis Brother   . High Cholesterol Mother   . High Cholesterol Father   . Diabetes Father   . Arthritis Mother     SOCIAL HISTORY:  History   Social History  . Marital Status: Married    Spouse Name: Lewis    Number of Children: 1  . Years of Education: college   Occupational History  . mammography   Social History Main Topics  . Smoking status: Never Smoker   . Smokeless tobacco: Not on file  . Alcohol Use: No  . Drug Use: No  . Sexual Activity: Not on file   Other Topics Concern  . Not on file   Social History Narrative   Patient lives at home with her husband [name]).   Patient works PRN.    Education college.   Right handed.   Caffeine One coke daily.     PHYSICAL EXAM   Filed Vitals:   03/30/15 1159  BP: 186/95  Pulse: 72  Height: 5\' 2"  (1.575 m)  Weight: 216 lb (97.977 kg)    Not recorded      Body mass index is 39.5 kg/(m^2).  PHYSICAL EXAMNIATION:  Gen: NAD, conversant, well nourised, obese, well groomed                     Cardiovascular: Regular rate rhythm, no peripheral edema, warm, nontender. Eyes: Conjunctivae clear without exudates or hemorrhage Neck: Supple, no carotid bruise. Pulmonary: Clear to auscultation bilaterally   NEUROLOGICAL EXAM:  MENTAL STATUS: Speech:    Speech is normal; fluent and spontaneous with normal comprehension.  Cognition: He mental status examination is 30 out of 30, animal naming is 26  CRANIAL NERVES: CN II: Visual fields are full to confrontation. Pupil equal round reactive to light CN III, IV,  VI: extraocular movement are normal. No ptosis. CN V: Facial sensation is intact to pinprick in all 3 divisions bilaterally. Corneal responses are intact.  CN VII: Face is symmetric with normal eye closure and smile. CN VIII: Hearing is normal to rubbing fingers CN IX, X: Palate elevates symmetrically. Phonation is normal. CN XI: Head turning and shoulder shrug are intact CN XII: Tongue is midline with normal movements and no atrophy.  MOTOR: There is no pronator drift of out-stretched arms. Muscle bulk and tone are normal. Muscle strength is normal.  REFLEXES: Reflexes are 2+ and symmetric at the biceps, triceps, knees, and ankles. Plantar responses are flexor.  SENSORY: Light touch, pinprick, position sense,  and vibration sense are intact in fingers and toes.  COORDINATION: Rapid alternating movements and fine finger movements are intact. There is no dysmetria on finger-to-nose and heel-knee-shin. There are no abnormal or extraneous movements.   GAIT/STANCE: Posture is normal. Gait is steady with normal steps, base, arm swing, and turning. Heel and toe walking are normal. Tandem gait is normal.  Romberg is absent.   DIAGNOSTIC DATA (LABS, IMAGING, TESTING) - I reviewed patient records, labs, notes, testing and imaging myself where available.  ASSESSMENT AND PLAN  [name] is a 54 y.o. female with few months history of memory trouble, Mini-Mental Status Examination is 30 out of 30, MOCA 28 out of 30 this happened in the setting of excessive stress, which most likely play a major role in her complains,  MRI brain showed right frontal venous anomaly, which will not explain her memory loss, She complains of excessive marital stress, which contributed to her complains of difficulty focusing, memory trouble, continue treatment her psychologist, return to clinic for new issues    Levert Feinstein M.D. Ph.D.  [facility] [address], [address] [place],  [place] [address] [phone]

## 2015-04-08 ENCOUNTER — Other Ambulatory Visit: Payer: Self-pay | Admitting: Obstetrics and Gynecology

## 2015-04-13 LAB — CYTOLOGY - PAP

## 2015-04-21 ENCOUNTER — Other Ambulatory Visit: Payer: Self-pay | Admitting: Obstetrics and Gynecology

## 2015-04-21 DIAGNOSIS — N6452 Nipple discharge: Secondary | ICD-10-CM

## 2015-04-22 ENCOUNTER — Ambulatory Visit
Admission: RE | Admit: 2015-04-22 | Discharge: 2015-04-22 | Disposition: A | Discharge status: Home / Self Care | Payer: BC Managed Care – PPO | Source: Ambulatory Visit | Attending: Obstetrics and Gynecology | Admitting: Obstetrics and Gynecology

## 2015-04-22 DIAGNOSIS — N6452 Nipple discharge: Secondary | ICD-10-CM

## 2016-03-01 ENCOUNTER — Other Ambulatory Visit: Payer: Self-pay | Admitting: Physical Medicine and Rehabilitation

## 2016-03-01 DIAGNOSIS — M47817 Spondylosis without myelopathy or radiculopathy, lumbosacral region: Secondary | ICD-10-CM

## 2016-03-11 ENCOUNTER — Ambulatory Visit
Admission: RE | Admit: 2016-03-11 | Discharge: 2016-03-11 | Disposition: A | Discharge status: Home / Self Care | Payer: BC Managed Care – PPO | Source: Ambulatory Visit | Attending: Physical Medicine and Rehabilitation | Admitting: Physical Medicine and Rehabilitation

## 2016-03-11 DIAGNOSIS — M47817 Spondylosis without myelopathy or radiculopathy, lumbosacral region: Secondary | ICD-10-CM

## 2016-03-11 MED ORDER — GADOBENATE DIMEGLUMINE 529 MG/ML IV SOLN
20.0000 mL | Freq: Once | INTRAVENOUS | Status: AC | PRN
  Administered 2016-03-11: 20 mL via INTRAVENOUS

## 2016-04-20 ENCOUNTER — Telehealth: Payer: Self-pay | Admitting: Neurology

## 2016-04-20 NOTE — Telephone Encounter (Signed)
error 

## 2016-05-11 ENCOUNTER — Other Ambulatory Visit: Payer: Self-pay | Admitting: Obstetrics and Gynecology

## 2016-05-11 DIAGNOSIS — Z1231 Encounter for screening mammogram for malignant neoplasm of breast: Secondary | ICD-10-CM

## 2016-06-09 ENCOUNTER — Ambulatory Visit: Payer: BC Managed Care – PPO

## 2016-08-07 ENCOUNTER — Ambulatory Visit
Admission: RE | Admit: 2016-08-07 | Discharge: 2016-08-07 | Disposition: A | Discharge status: Home / Self Care | Payer: BC Managed Care – PPO | Source: Ambulatory Visit | Attending: Obstetrics and Gynecology | Admitting: Obstetrics and Gynecology

## 2016-08-07 DIAGNOSIS — Z1231 Encounter for screening mammogram for malignant neoplasm of breast: Secondary | ICD-10-CM

## 2016-11-17 ENCOUNTER — Ambulatory Visit (INDEPENDENT_AMBULATORY_CARE_PROVIDER_SITE_OTHER): Payer: BC Managed Care – PPO | Admitting: Psychology

## 2016-11-17 DIAGNOSIS — F4323 Adjustment disorder with mixed anxiety and depressed mood: Secondary | ICD-10-CM | POA: Diagnosis not present

## 2016-11-27 ENCOUNTER — Ambulatory Visit (INDEPENDENT_AMBULATORY_CARE_PROVIDER_SITE_OTHER): Payer: BC Managed Care – PPO | Admitting: Psychology

## 2016-11-27 DIAGNOSIS — F4323 Adjustment disorder with mixed anxiety and depressed mood: Secondary | ICD-10-CM

## 2017-01-19 ENCOUNTER — Ambulatory Visit (INDEPENDENT_AMBULATORY_CARE_PROVIDER_SITE_OTHER): Payer: BC Managed Care – PPO | Admitting: Psychology

## 2017-01-19 DIAGNOSIS — F4323 Adjustment disorder with mixed anxiety and depressed mood: Secondary | ICD-10-CM

## 2017-04-18 ENCOUNTER — Ambulatory Visit (INDEPENDENT_AMBULATORY_CARE_PROVIDER_SITE_OTHER): Payer: BC Managed Care – PPO | Admitting: Psychology

## 2017-04-18 DIAGNOSIS — F4323 Adjustment disorder with mixed anxiety and depressed mood: Secondary | ICD-10-CM

## 2017-06-13 ENCOUNTER — Ambulatory Visit (INDEPENDENT_AMBULATORY_CARE_PROVIDER_SITE_OTHER): Payer: BC Managed Care – PPO | Admitting: Psychology

## 2017-06-13 DIAGNOSIS — F4323 Adjustment disorder with mixed anxiety and depressed mood: Secondary | ICD-10-CM

## 2018-02-14 ENCOUNTER — Ambulatory Visit
Admission: RE | Admit: 2018-02-14 | Discharge: 2018-02-14 | Disposition: A | Discharge status: Home / Self Care | Payer: BLUE CROSS/BLUE SHIELD | Source: Ambulatory Visit | Attending: Physical Medicine and Rehabilitation | Admitting: Physical Medicine and Rehabilitation

## 2018-02-14 ENCOUNTER — Other Ambulatory Visit: Payer: Self-pay | Admitting: Physical Medicine and Rehabilitation

## 2018-02-14 DIAGNOSIS — M546 Pain in thoracic spine: Secondary | ICD-10-CM

## 2018-04-05 ENCOUNTER — Other Ambulatory Visit: Payer: Self-pay | Admitting: Physical Medicine and Rehabilitation

## 2018-04-05 DIAGNOSIS — M546 Pain in thoracic spine: Secondary | ICD-10-CM

## 2018-04-15 ENCOUNTER — Ambulatory Visit
Admission: RE | Admit: 2018-04-15 | Discharge: 2018-04-15 | Disposition: A | Discharge status: Home / Self Care | Payer: BLUE CROSS/BLUE SHIELD | Source: Ambulatory Visit | Attending: Physical Medicine and Rehabilitation | Admitting: Physical Medicine and Rehabilitation

## 2018-04-15 DIAGNOSIS — M546 Pain in thoracic spine: Secondary | ICD-10-CM

## 2018-07-23 ENCOUNTER — Other Ambulatory Visit: Payer: Self-pay | Admitting: Geriatric Medicine

## 2018-07-23 DIAGNOSIS — Z1231 Encounter for screening mammogram for malignant neoplasm of breast: Secondary | ICD-10-CM

## 2018-08-30 ENCOUNTER — Ambulatory Visit
Admission: RE | Admit: 2018-08-30 | Discharge: 2018-08-30 | Disposition: A | Discharge status: Home / Self Care | Payer: BLUE CROSS/BLUE SHIELD | Source: Ambulatory Visit | Attending: Nurse Practitioner | Admitting: Nurse Practitioner

## 2018-08-30 DIAGNOSIS — Z1231 Encounter for screening mammogram for malignant neoplasm of breast: Secondary | ICD-10-CM

## 2020-02-23 ENCOUNTER — Other Ambulatory Visit: Payer: Self-pay | Admitting: Internal Medicine

## 2020-02-23 DIAGNOSIS — Z1231 Encounter for screening mammogram for malignant neoplasm of breast: Secondary | ICD-10-CM

## 2020-03-01 ENCOUNTER — Ambulatory Visit
Admission: RE | Admit: 2020-03-01 | Discharge: 2020-03-01 | Disposition: A | Discharge status: Home / Self Care | Payer: BLUE CROSS/BLUE SHIELD | Source: Ambulatory Visit | Attending: Internal Medicine | Admitting: Internal Medicine

## 2020-03-01 ENCOUNTER — Other Ambulatory Visit: Payer: Self-pay | Admitting: Geriatric Medicine

## 2020-03-01 ENCOUNTER — Other Ambulatory Visit: Payer: Self-pay

## 2020-03-01 DIAGNOSIS — Z1231 Encounter for screening mammogram for malignant neoplasm of breast: Secondary | ICD-10-CM

## 2021-05-04 ENCOUNTER — Other Ambulatory Visit: Payer: Self-pay | Admitting: Geriatric Medicine

## 2021-05-04 DIAGNOSIS — Z1231 Encounter for screening mammogram for malignant neoplasm of breast: Secondary | ICD-10-CM

## 2021-07-20 ENCOUNTER — Other Ambulatory Visit: Payer: Self-pay | Admitting: Physical Medicine and Rehabilitation

## 2021-07-20 DIAGNOSIS — M48061 Spinal stenosis, lumbar region without neurogenic claudication: Secondary | ICD-10-CM

## 2021-07-20 DIAGNOSIS — M545 Low back pain, unspecified: Secondary | ICD-10-CM

## 2021-08-06 ENCOUNTER — Other Ambulatory Visit: Payer: Self-pay

## 2021-08-11 ENCOUNTER — Ambulatory Visit
Admission: RE | Admit: 2021-08-11 | Discharge: 2021-08-11 | Disposition: A | Discharge status: Home / Self Care | Payer: No Typology Code available for payment source | Source: Ambulatory Visit | Attending: Physical Medicine and Rehabilitation | Admitting: Physical Medicine and Rehabilitation

## 2021-08-11 ENCOUNTER — Other Ambulatory Visit: Payer: Self-pay

## 2021-08-11 ENCOUNTER — Ambulatory Visit
Admission: RE | Admit: 2021-08-11 | Discharge: 2021-08-11 | Disposition: A | Discharge status: Home / Self Care | Payer: No Typology Code available for payment source | Source: Ambulatory Visit | Attending: Geriatric Medicine | Admitting: Geriatric Medicine

## 2021-08-11 DIAGNOSIS — M545 Low back pain, unspecified: Secondary | ICD-10-CM

## 2021-08-11 DIAGNOSIS — M48061 Spinal stenosis, lumbar region without neurogenic claudication: Secondary | ICD-10-CM

## 2021-08-11 DIAGNOSIS — Z1231 Encounter for screening mammogram for malignant neoplasm of breast: Secondary | ICD-10-CM

## 2021-08-11 MED ORDER — GADOBENATE DIMEGLUMINE 529 MG/ML IV SOLN
19.0000 mL | Freq: Once | INTRAVENOUS | Status: AC | PRN
  Administered 2021-08-11: 19 mL via INTRAVENOUS

## 2021-08-12 ENCOUNTER — Other Ambulatory Visit: Payer: Self-pay | Admitting: Geriatric Medicine

## 2021-08-12 DIAGNOSIS — R928 Other abnormal and inconclusive findings on diagnostic imaging of breast: Secondary | ICD-10-CM

## 2021-09-06 ENCOUNTER — Other Ambulatory Visit: Payer: Self-pay | Admitting: Internal Medicine

## 2021-09-07 ENCOUNTER — Ambulatory Visit: Admission: RE | Admit: 2021-09-07 | Payer: No Typology Code available for payment source | Source: Ambulatory Visit

## 2021-09-07 ENCOUNTER — Ambulatory Visit
Admission: RE | Admit: 2021-09-07 | Discharge: 2021-09-07 | Disposition: A | Discharge status: Home / Self Care | Payer: No Typology Code available for payment source | Source: Ambulatory Visit | Attending: Geriatric Medicine | Admitting: Geriatric Medicine

## 2021-09-07 DIAGNOSIS — R928 Other abnormal and inconclusive findings on diagnostic imaging of breast: Secondary | ICD-10-CM

## 2021-12-21 ENCOUNTER — Ambulatory Visit
Admission: RE | Admit: 2021-12-21 | Discharge: 2021-12-21 | Disposition: A | Discharge status: Home / Self Care | Payer: No Typology Code available for payment source | Source: Ambulatory Visit | Attending: Physical Medicine and Rehabilitation | Admitting: Physical Medicine and Rehabilitation

## 2021-12-21 ENCOUNTER — Other Ambulatory Visit: Payer: Self-pay | Admitting: Physical Medicine and Rehabilitation

## 2021-12-21 DIAGNOSIS — M25552 Pain in left hip: Secondary | ICD-10-CM

## 2022-07-28 ENCOUNTER — Other Ambulatory Visit: Payer: Self-pay | Admitting: Physical Medicine and Rehabilitation

## 2022-07-28 DIAGNOSIS — M5416 Radiculopathy, lumbar region: Secondary | ICD-10-CM

## 2022-08-12 ENCOUNTER — Ambulatory Visit
Admission: RE | Admit: 2022-08-12 | Discharge: 2022-08-12 | Disposition: A | Discharge status: Home / Self Care | Payer: No Typology Code available for payment source | Source: Ambulatory Visit | Attending: Physical Medicine and Rehabilitation | Admitting: Physical Medicine and Rehabilitation

## 2022-08-12 DIAGNOSIS — M5416 Radiculopathy, lumbar region: Secondary | ICD-10-CM

## 2022-08-12 MED ORDER — GADOPICLENOL 0.5 MMOL/ML IV SOLN
10.0000 mL | Freq: Once | INTRAVENOUS | Status: AC | PRN
  Administered 2022-08-12: 10 mL via INTRAVENOUS

## 2023-09-11 IMAGING — MG MM DIGITAL DIAGNOSTIC UNILAT*R* W/ TOMO W/ CAD
6 series · 6 of 18 positions shown · non-contrast
Comparison: Previous exams including recent screening mammogram
dated 08/11/2021 and screening mammogram dated 04/22/2015.

CLINICAL DATA: Patient returns today to evaluate possible RIGHT
breast asymmetries and possible RIGHT axillary lymphadenopathy.
Patient describes recent shingle and flu vaccines in the RIGHT arm
which were obtained prior to the screening mammogram of 08/11/2021.

EXAM:
DIGITAL DIAGNOSTIC UNILATERAL RIGHT MAMMOGRAM WITH TOMOSYNTHESIS AND
CAD
TECHNIQUE: Right digital diagnostic mammography and breast tomosynthesis was
performed. The images were evaluated with computer-aided detection.

[R ML synth-2D]
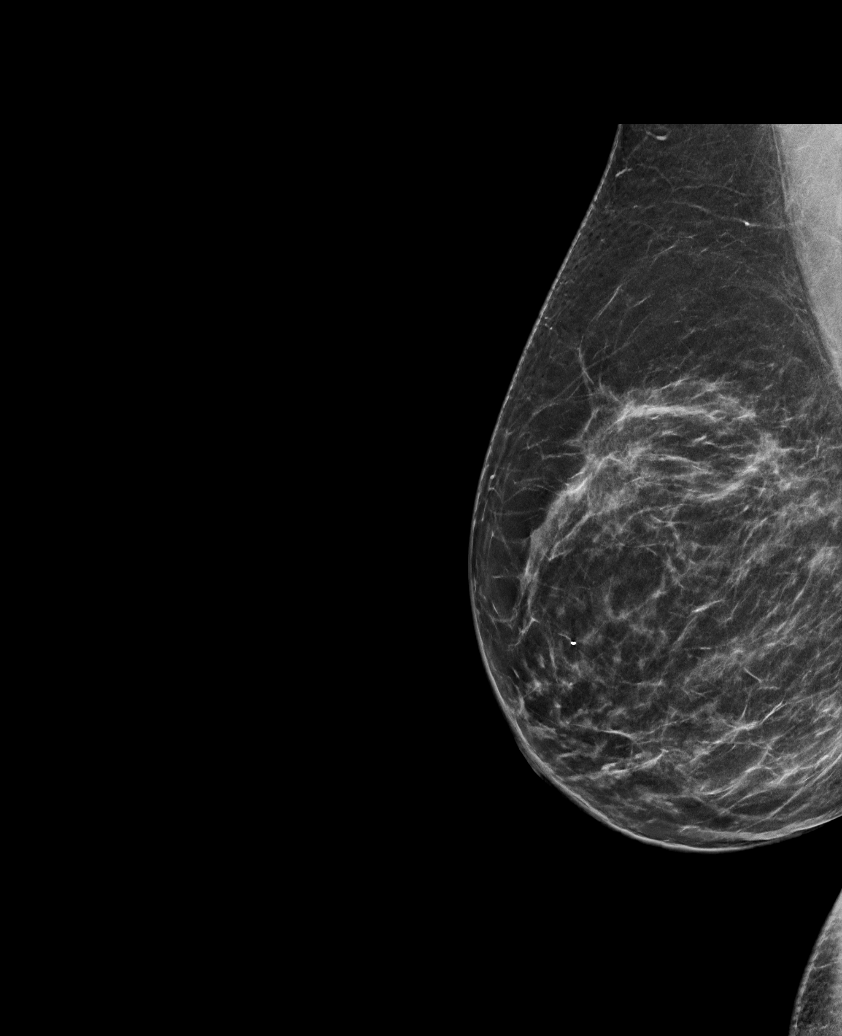

[R CC synth-2D (1 of 2)]
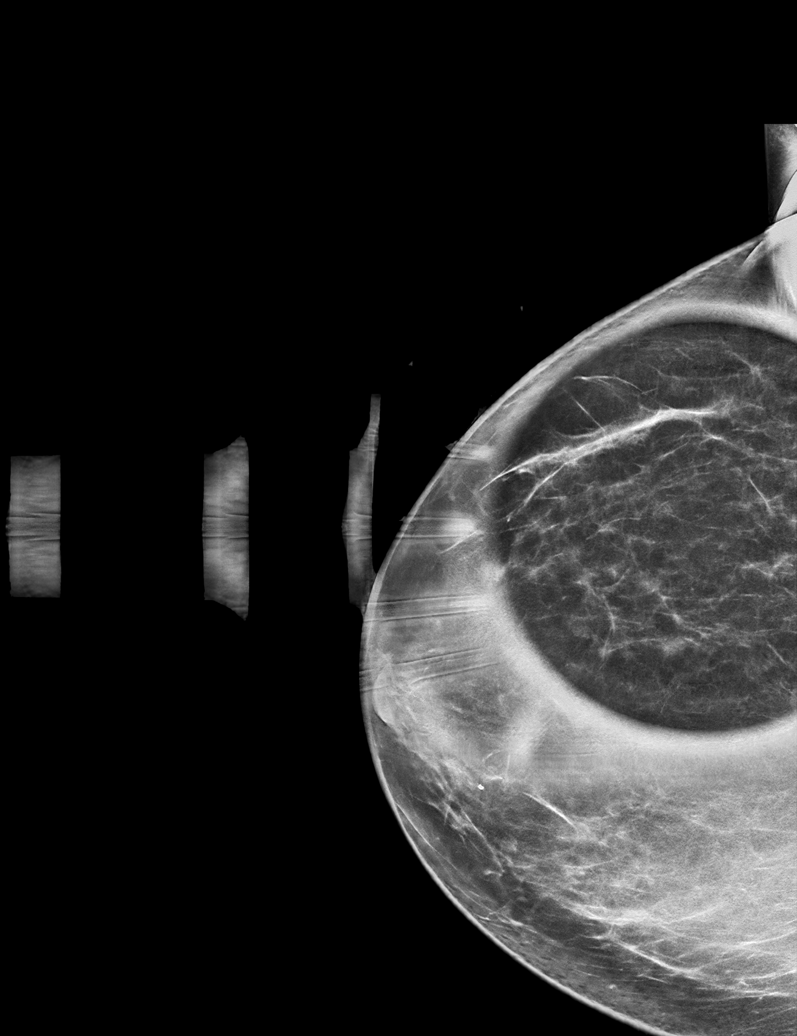

[R CC synth-2D (2 of 2)]
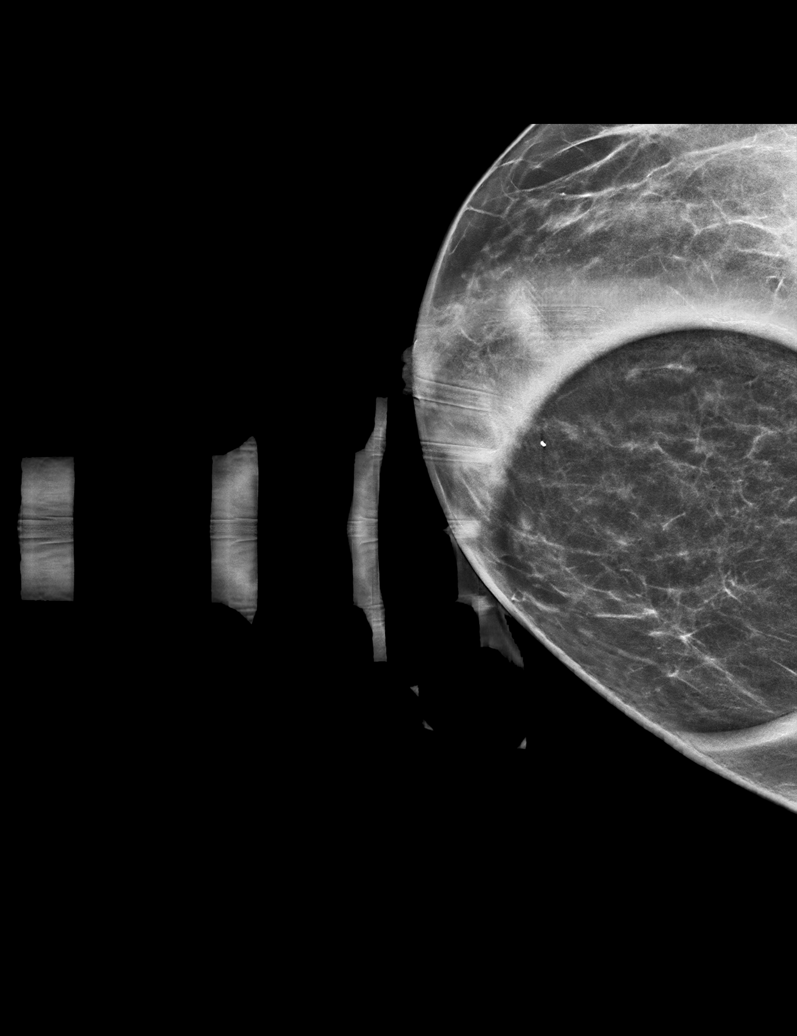

[R CC tomo (1 of 2) · tomo slice 33/66.0]
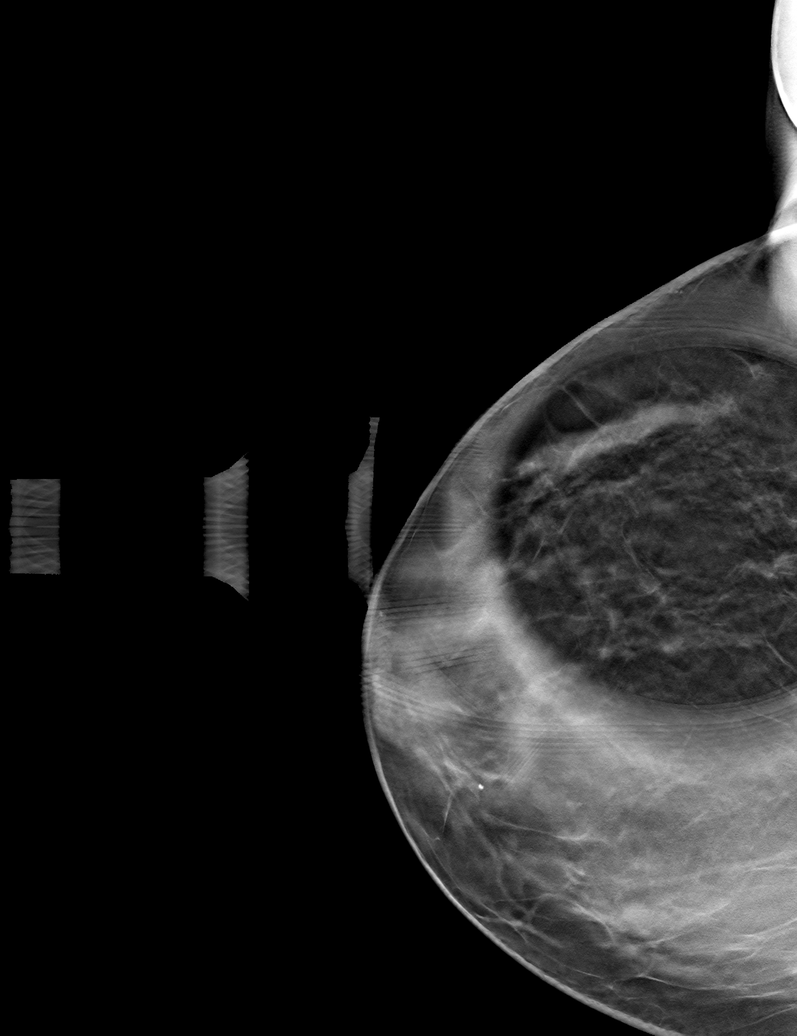

[R ML tomo · tomo slice 34/67.0]
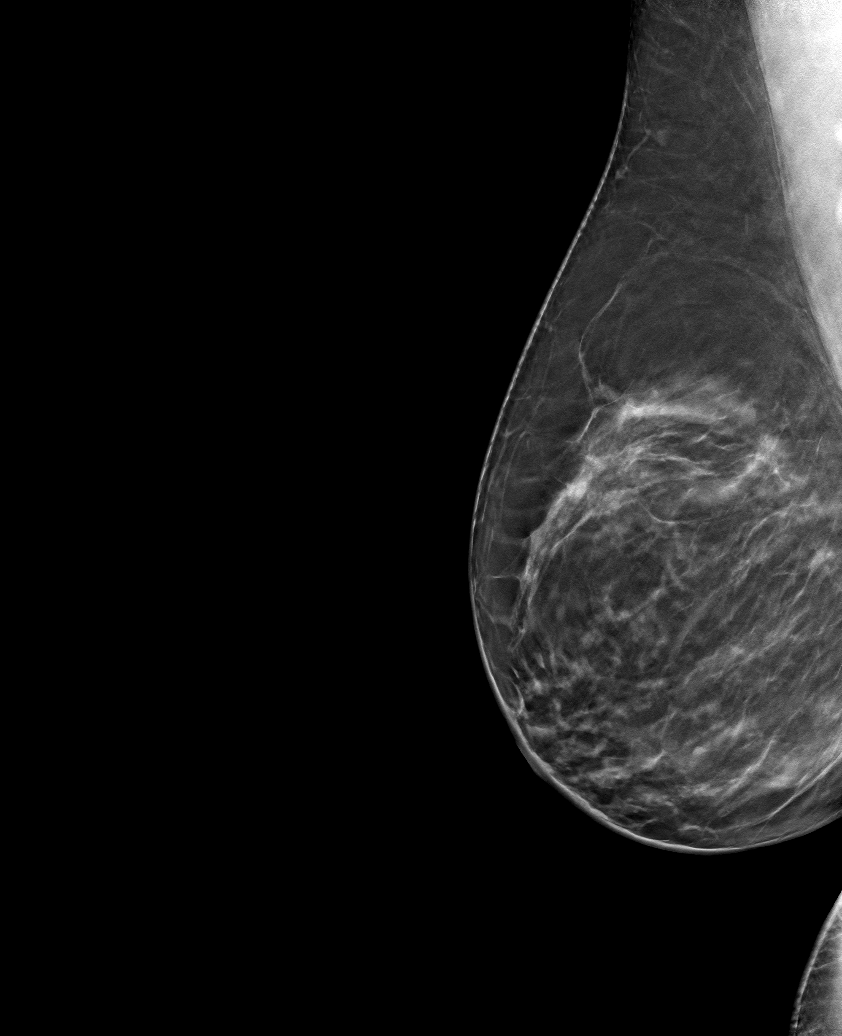

[R CC tomo (2 of 2) · tomo slice 29/57.0]
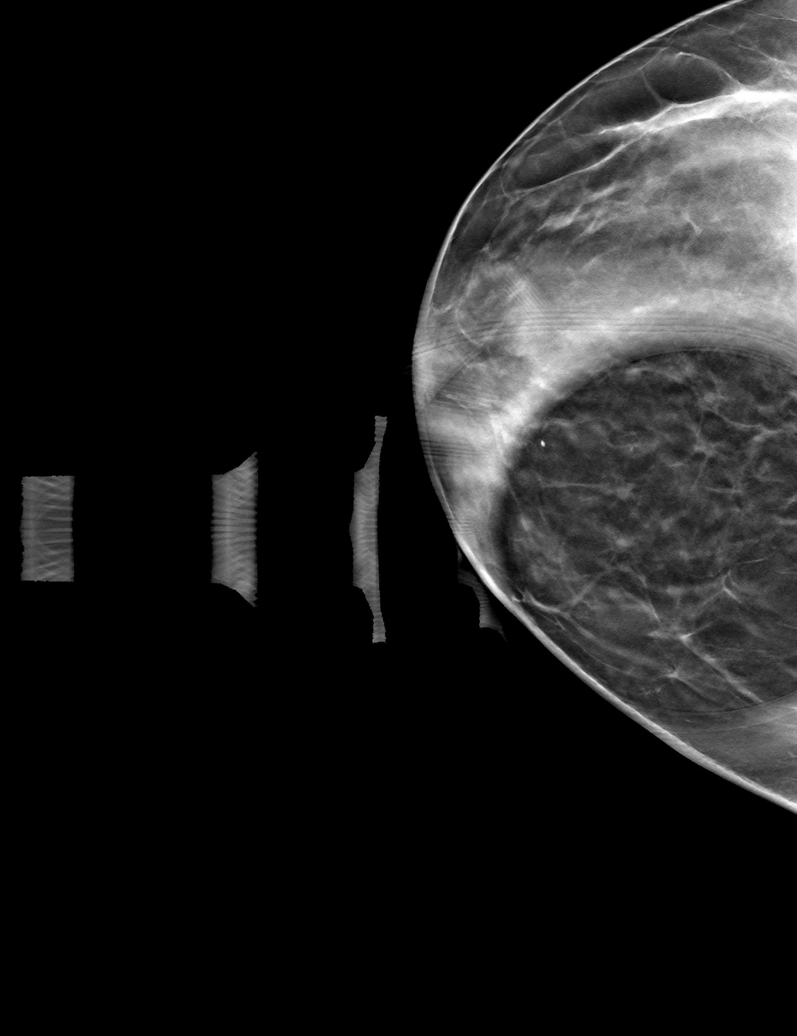

[6 of 18 positions shown; findings below may reference images not displayed]

ACR Breast Density Category b: There are scattered areas of
fibroglandular density.
FINDINGS: On today's additional diagnostic views, including spot compression
with 3D tomosynthesis, there is no persistent asymmetry within the
outer or inner RIGHT breast indicating superimposition of normal
fibroglandular tissues.

The minimally prominent lymph nodes in the RIGHT axilla can be
explained by patient's history of recent shingles and flu vaccines
in the RIGHT arm, however, these lymph nodes are not significantly
changed in appearance compared to diagnostic mammogram of 04/22/2015
indicating benignity.
IMPRESSION: No evidence of malignancy.

Patient may return to routine annual bilateral screening mammogram
schedule.

RECOMMENDATION:
Screening mammogram in one year.(Code:[id number])

I have discussed the findings and recommendations with the patient.
If applicable, a reminder letter will be sent to the patient
regarding the next appointment.

BI-RADS CATEGORY  1: Negative.
